# Patient Record
Sex: Female | Born: 1990 | Race: White | Hispanic: No | Marital: Single | Smoking: Never smoker
Health system: Southern US, Community
[De-identification: ages and names within clinical notes are randomized; demographics above are authoritative.]

## PROBLEM LIST (undated history)

## (undated) DIAGNOSIS — Z8669 Personal history of other diseases of the nervous system and sense organs: Secondary | ICD-10-CM

## (undated) HISTORY — PX: MOUTH SURGERY: SHX715

## (undated) HISTORY — PX: KNEE ARTHROSCOPY: SUR90

## (undated) HISTORY — DX: Personal history of other diseases of the nervous system and sense organs: Z86.69

---
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
## 2009-01-31 IMAGING — CR DG CHEST 2V
2 series · 2 of 2 positions shown · non-contrast
Comparison: None

CLINICAL DATA: Chest pain, cough

CHEST - 2 VIEW:

[w chest pa]
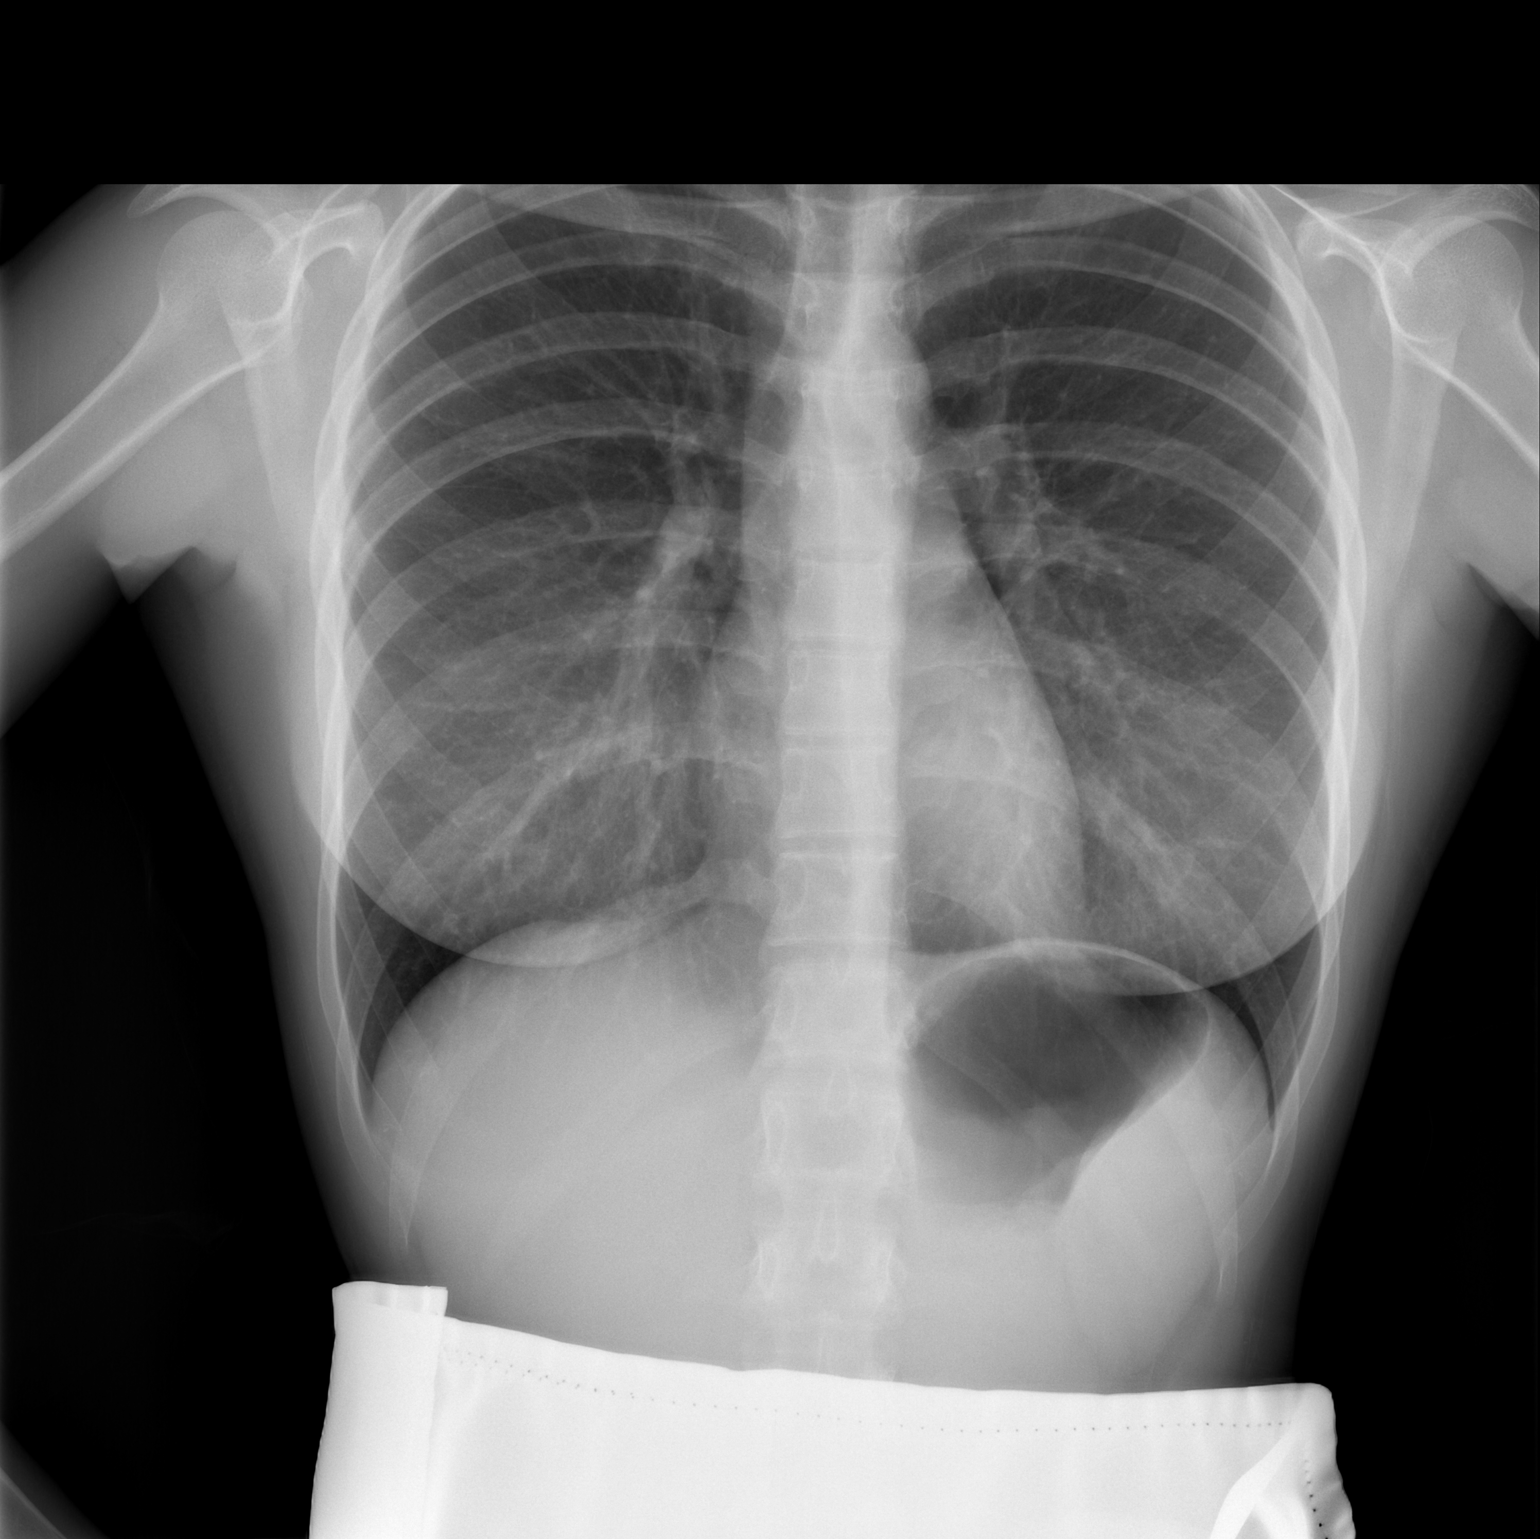

[w chest lat]
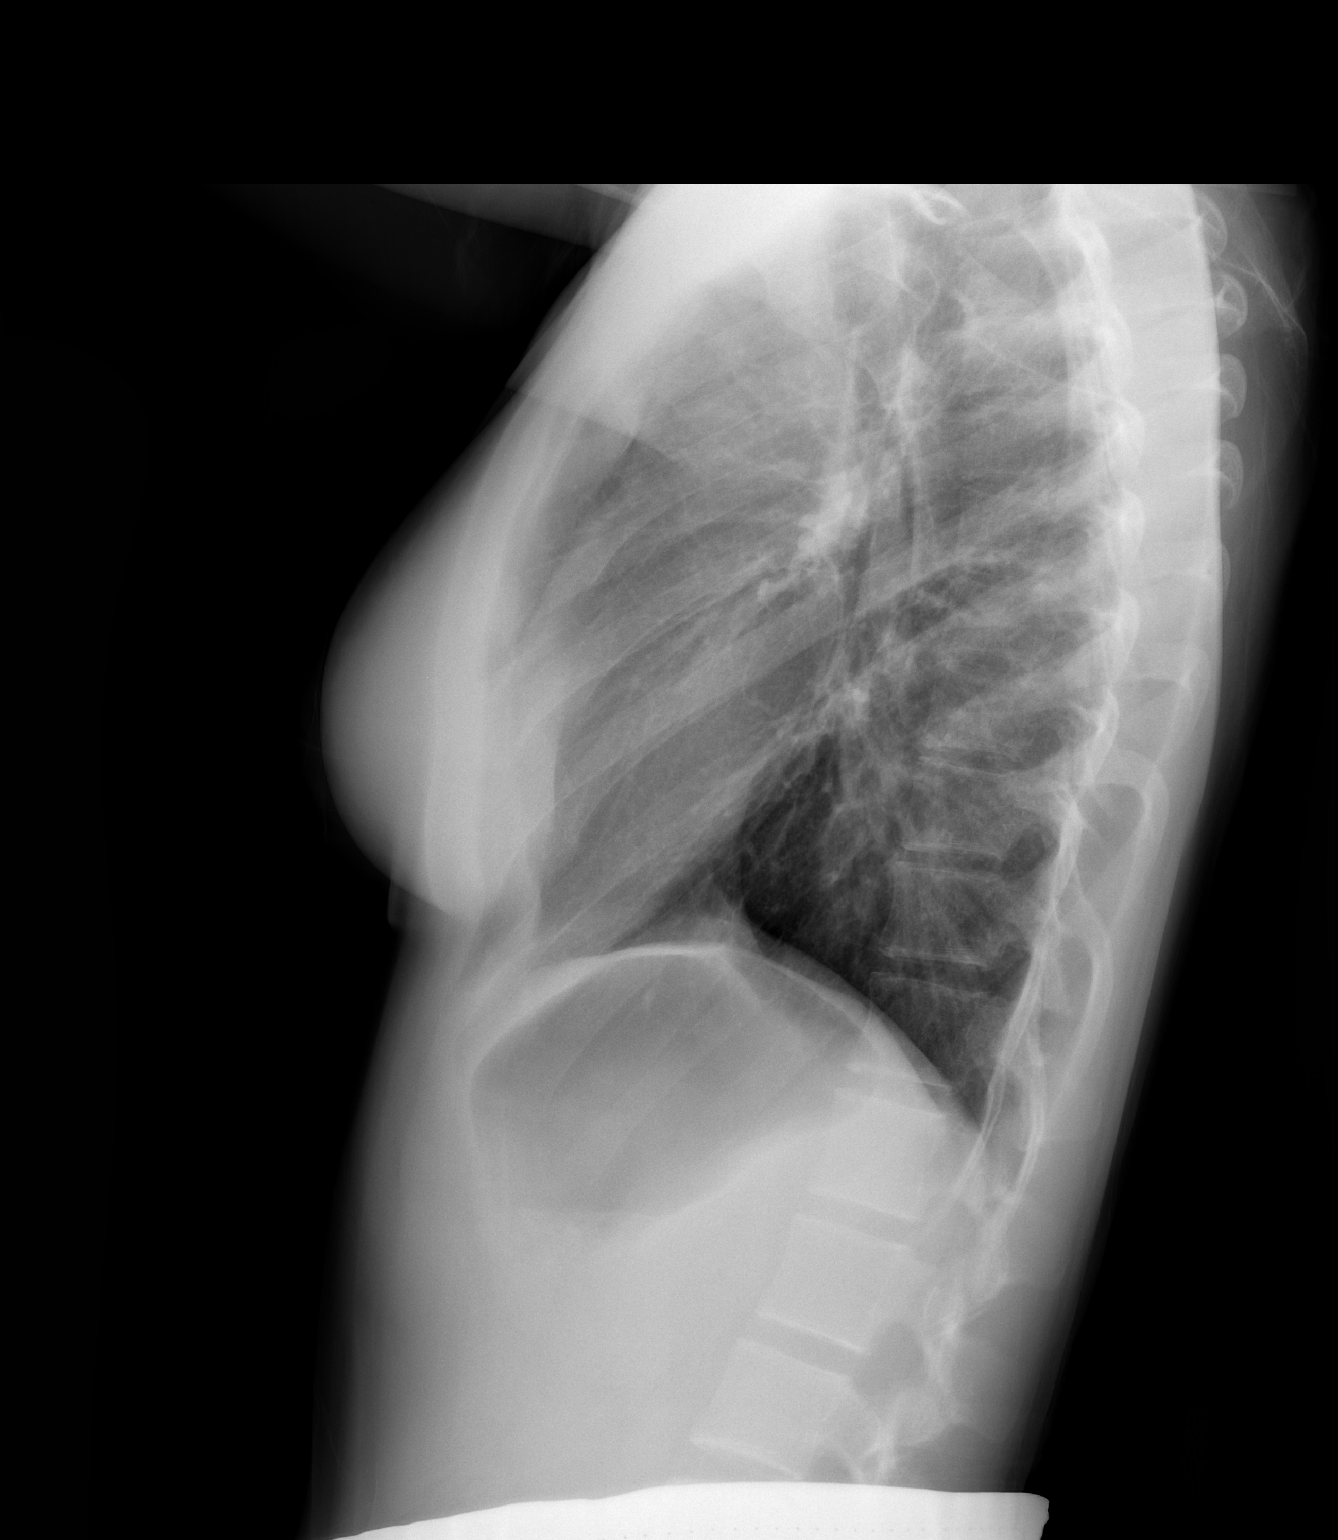

[2 of 2 positions shown; findings below may reference images not displayed]

FINDINGS: The heart size and mediastinal contours are within normal limits. 
Mild peribronchial thickening noted. No focal opacities. The visualized skeletal
structures are unremarkable.
IMPRESSION: Mild bronchitic changes.

## 2010-10-30 NOTE — Op Note (Signed)
NAME:  Katelyn Cummings, Katelyn Cummings                          ACCOUNT NO.:  [id number]   MEDICAL RECORD NO.:  [id number]                   PATIENT TYPE:  AMB   LOCATION:  DSC                                  FACILITY:  [facility]   PHYSICIAN:  Lubertha Basque. Jerl Santos, M.D.             DATE OF BIRTH:  01-Dec-1990   DATE OF PROCEDURE:  11/19/2003  DATE OF DISCHARGE:                                 OPERATIVE REPORT   PREOPERATIVE DIAGNOSIS:  Right knee chondromalacia patella.   POSTOPERATIVE DIAGNOSES:  1. Right knee chondromalacia patella.  2. Right knee loose bodies.   OPERATION PERFORMED:  1. Right knee arthroscopic chondroplasty.  2. Right knee arthroscopic removal of loose bodies.  3. Right knee arthroscopic lateral release.   SURGEON:  Lubertha Basque. Jerl Santos, M.D.   ANESTHESIA:  General.   INDICATIONS FOR PROCEDURE:  The patient is a 20 year old girl with several  years of anterior knee pain on the right.  This has persisted despite  activity modifications and several months of supervised physical therapy.  She has persisted with swelling and pain along the medial aspect.  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  She is offered an arthroscopy.  Informed operative consent was obtained after discussion of possible  complications of reaction to anesthesia and infection.   DESCRIPTION OF PROCEDURE:  The patient was taken to the operating suite  where general anesthetic was applied without difficulty.  The patient was  positioned supine and prepped and draped in the normal sterile fashion.  After administration of preop IV antibiotics, an arthroscopy of the right  knee was performed through a total of three portals.  Medial and lateral  compartments exhibited no evidence of meniscal  or articular cartilage injury  though both had several intra-articular loose bodies which I removed.  The  largest was about 4 mm in diameter.  ACL and PCL appeared intact.  Her  trouble seemed mostly confined to the patellofemoral portion of her knee.  The patella tracked in a far lateral position.  She had extreme softening of  the medial patellar facet and break down of the central portion of the  patella.  A thorough chondroplasty was done but I did not remove all  devitalized cartilage as this would have denuded the entire knee cap.  She  had very soft cartilage along the medial patellar facet and along about half  the knee cap. Through the additional portal, I performed an arthroscopic  lateral release.  This did seem to improve the tracking of her patella.  Pump pressure was decreased and I controlled some bleeders with the Bovie  cautery.  The knee was thoroughly irrigated at the end  of the case followed  by placement of Marcaine and morphine.  Adaptic was placed on the portals  followed by dry gauze and loose Ace wrap.  Estimated blood loss and  intraoperative fluids can be obtained from anesthesia records.   DISPOSITION:  The patient was extubated in the operating room and taken to  the recovery room in stable condition.  Plans were for the patient to go  home the same day and to follow up in the office in less than a week.  I  will contact her by phone tonight.                                               Lubertha Basque Jerl Santos, M.D.    PGD/MEDQ  D:  11/19/2003  T:  11/19/2003  Job:  [id number]

## 2012-09-21 ENCOUNTER — Encounter: Payer: Self-pay | Admitting: Obstetrics & Gynecology

## 2012-09-21 ENCOUNTER — Ambulatory Visit (INDEPENDENT_AMBULATORY_CARE_PROVIDER_SITE_OTHER): Payer: BC Managed Care – PPO | Admitting: Obstetrics & Gynecology

## 2012-09-21 VITALS — BP 122/78 | Ht 63.5 in | Wt 118.8 lb

## 2012-09-21 DIAGNOSIS — Z01419 Encounter for gynecological examination (general) (routine) without abnormal findings: Secondary | ICD-10-CM

## 2012-09-21 DIAGNOSIS — Z2089 Contact with and (suspected) exposure to other communicable diseases: Secondary | ICD-10-CM

## 2012-09-21 DIAGNOSIS — Z202 Contact with and (suspected) exposure to infections with a predominantly sexual mode of transmission: Secondary | ICD-10-CM

## 2012-09-21 MED ORDER — DROSPIRENONE-ETHINYL ESTRADIOL 3-0.02 MG PO TABS: 1.0000 | ORAL_TABLET | Freq: Every day | ORAL | Status: DC

## 2012-09-21 NOTE — Progress Notes (Signed)
22 y.o. G0P0000 SingleCaucasianF here for annual exam.  Doing really well.  Cycles lasting about 5 days and regular.  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  Made good money, per pt.  New sex partner.  Did Gardisil 2008 and 2009.  Patient's last menstrual period was 09/02/2012.          Sexually active: yes  The current method of family planning is OCP (estrogen/progesterone).    Exercising: yes  cardio Smoker:  no  Health Maintenance: Pap:  08/26/11 WNL MMG:  none Colonoscopy:  none BMD:   none TDaP:  Unsure, feels like she is up to date Labs: no routine screening labs   reports that she has never smoked. She does not have any smokeless tobacco history on file. She reports that she drinks about 0.5 ounces of alcohol per week. She reports that she does not use illicit drugs.  No past medical history on file.   See updates.   No past surgical history on file.  See updates.  Current Outpatient Prescriptions  Medication Sig Dispense Refill  . Ascorbic Acid (VITAMIN C PO) Take by mouth daily.      . cetirizine (ZYRTEC) 10 MG tablet Take 10 mg by mouth as needed for allergies.      . drospirenone-ethinyl estradiol (YAZ,GIANVI,LORYNA) 3-0.02 MG tablet Take 1 tablet by mouth daily.      . Multiple Vitamins-Minerals (MULTIVITAMIN PO) Take by mouth daily.      . rizatriptan (MAXALT) 10 MG tablet Take 10 mg by mouth as needed for migraine. May repeat in 2 hours if needed      . Topiramate (TOPAMAX PO) Take 150 mg by mouth daily.       No current facility-administered medications for this visit.    No family history on file.  ROS:  Pertinent items are noted in HPI.  Otherwise, a comprehensive ROS was negative.  Exam:   BP 122/78  Ht 5' 3.5" (1.613 m)  Wt 118 lb 12.8 oz (53.887 kg)  BMI 20.71 kg/m2  LMP 09/02/2012  Wt change 13 pounds Height:   Height: 5' 3.5" (161.3 cm)  Ht Readings from Last 3  Encounters:  09/21/12 5' 3.5" (1.613 m)    General appearance: alert, cooperative and appears stated age Head: Normocephalic, without obvious abnormality, atraumatic Neck: no adenopathy, supple, symmetrical, trachea midline and thyroid normal to inspection and palpation Lungs: clear to auscultation bilaterally Breasts: normal appearance, no masses or tenderness Heart: regular rate and rhythm Abdomen: soft, non-tender; bowel sounds normal; no masses,  no organomegaly Extremities: extremities normal, atraumatic, no cyanosis or edema Skin: Skin color, texture, turgor normal. No rashes or lesions Lymph nodes: Cervical, supraclavicular, and axillary nodes normal. No abnormal inguinal nodes palpated Neurologic: Grossly normal   Pelvic: External genitalia:  no lesions              Urethra:  normal appearing urethra with no masses, tenderness or lesions              Bartholins and Skenes: normal                 Vagina: normal appearing vagina with normal color and discharge, no lesions              Cervix: no lesions              Pap taken: no Bimanual Exam:  Uterus:  normal size, contour, position, consistency, mobility, non-tender              Adnexa: normal adnexa               Rectovaginal: Confirms               Anus:  normal sphincter tone, no lesions  A:  Well Woman with normal exam Sexually active, new partner in last year H/o migraines without aura On birth control  P:   pap smear per guidelines GC/Chl off urine testing today Rx for Yaz sent to pt's pharmacy in [facility] for one year return annually or prn  An After Visit Summary was printed and given to the patient.

## 2012-09-21 NOTE — Patient Instructions (Signed)

## 2012-09-22 ENCOUNTER — Encounter: Payer: Self-pay | Admitting: Obstetrics & Gynecology

## 2012-09-25 ENCOUNTER — Telehealth: Payer: Self-pay | Admitting: *Deleted

## 2012-09-25 NOTE — Telephone Encounter (Signed)
LMTCB 1815/[id number]

## 2012-09-25 NOTE — Telephone Encounter (Signed)
Message copied by Alisa Graff on Mon Sep 25, 2012  6:17 PM ------      Message from: Jerene Bears      Created: Fri Sep 22, 2012  2:32 PM       Inform GC/Chl both neg ------

## 2012-09-26 ENCOUNTER — Telehealth: Payer: Self-pay | Admitting: *Deleted

## 2012-10-07 ENCOUNTER — Encounter: Payer: Self-pay | Admitting: Obstetrics & Gynecology

## 2012-10-07 MED ORDER — YAZ 3-0.02 MG PO TABS: 1.0000 | ORAL_TABLET | Freq: Every day | ORAL | Status: DC

## 2012-10-11 NOTE — Telephone Encounter (Signed)
Pt notified in result note on 09/27/12.  Closing encounter.

## 2012-10-11 NOTE — Telephone Encounter (Signed)
Pt notified GC/Chl both neg in 09/27/12 result note.  Closing encounter.

## 2013-04-19 ENCOUNTER — Other Ambulatory Visit: Payer: Self-pay

## 2013-10-19 ENCOUNTER — Ambulatory Visit: Payer: BC Managed Care – PPO | Admitting: Obstetrics & Gynecology

## 2013-12-20 ENCOUNTER — Encounter: Payer: Self-pay | Admitting: Obstetrics & Gynecology

## 2013-12-20 ENCOUNTER — Other Ambulatory Visit: Payer: Self-pay | Admitting: Obstetrics & Gynecology

## 2013-12-21 ENCOUNTER — Other Ambulatory Visit: Payer: Self-pay | Admitting: Obstetrics & Gynecology

## 2013-12-21 MED ORDER — YAZ 3-0.02 MG PO TABS: 1.0000 | ORAL_TABLET | Freq: Every day | ORAL | Status: AC

## 2013-12-21 NOTE — Telephone Encounter (Signed)
Last AEX: 09/21/12 Last refill:10/07/12 #3 packs, 4 refills Current AEX: pt is due  Please advise

## 2013-12-21 NOTE — Telephone Encounter (Signed)
S/W pt and she stated she has moved and has [facility] Dr[facility] Hyacinth [facility]Miller [facility] and [facility] waiting for [facility] response. I stated to the pt that Dr. Hyacinth MeekerMiller was out of the office today and she wanted to wait for a response from her to see if she could get refills until she establishes a new provider. I voiced understanding and stated I will contact her next week.
